# Patient Record
Sex: Male | Born: 1965 | Race: White | Hispanic: No | Smoking: Current every day smoker
Health system: Southern US, Community
[De-identification: ages and names within clinical notes are randomized; demographics above are authoritative.]

## PROBLEM LIST (undated history)

## (undated) DIAGNOSIS — I10 Essential (primary) hypertension: Secondary | ICD-10-CM

## (undated) DIAGNOSIS — E785 Hyperlipidemia, unspecified: Secondary | ICD-10-CM

## (undated) HISTORY — PX: WRIST SURGERY: SHX841

## (undated) HISTORY — DX: Hyperlipidemia, unspecified: E78.5

## (undated) HISTORY — DX: Essential (primary) hypertension: I10

## (undated) HISTORY — PX: KNEE SURGERY: SHX244

---

## 2006-05-16 ENCOUNTER — Ambulatory Visit (HOSPITAL_COMMUNITY): Admission: RE | Admit: 2006-05-16 | Discharge: 2006-05-16 | Payer: Self-pay | Admitting: Family Medicine

## 2007-06-10 ENCOUNTER — Ambulatory Visit (HOSPITAL_COMMUNITY): Admission: RE | Admit: 2007-06-10 | Discharge: 2007-06-10 | Payer: Self-pay | Admitting: Family Medicine

## 2007-11-16 ENCOUNTER — Ambulatory Visit (HOSPITAL_COMMUNITY): Admission: RE | Admit: 2007-11-16 | Discharge: 2007-11-16 | Payer: Self-pay | Admitting: Family Medicine

## 2007-12-17 ENCOUNTER — Ambulatory Visit (HOSPITAL_COMMUNITY): Admission: RE | Admit: 2007-12-17 | Discharge: 2007-12-17 | Payer: Self-pay | Admitting: Orthopaedic Surgery

## 2008-09-02 ENCOUNTER — Ambulatory Visit (HOSPITAL_COMMUNITY): Admission: RE | Admit: 2008-09-02 | Discharge: 2008-09-02 | Payer: Self-pay | Admitting: Family Medicine

## 2009-09-02 IMAGING — CR DG CHEST 2V
2 series · 2 of 2 positions shown · non-contrast
Comparison: None

CLINICAL DATA: Cough, congestion, smoker

CHEST - 2 VIEW

[view not recorded (1 of 2)]
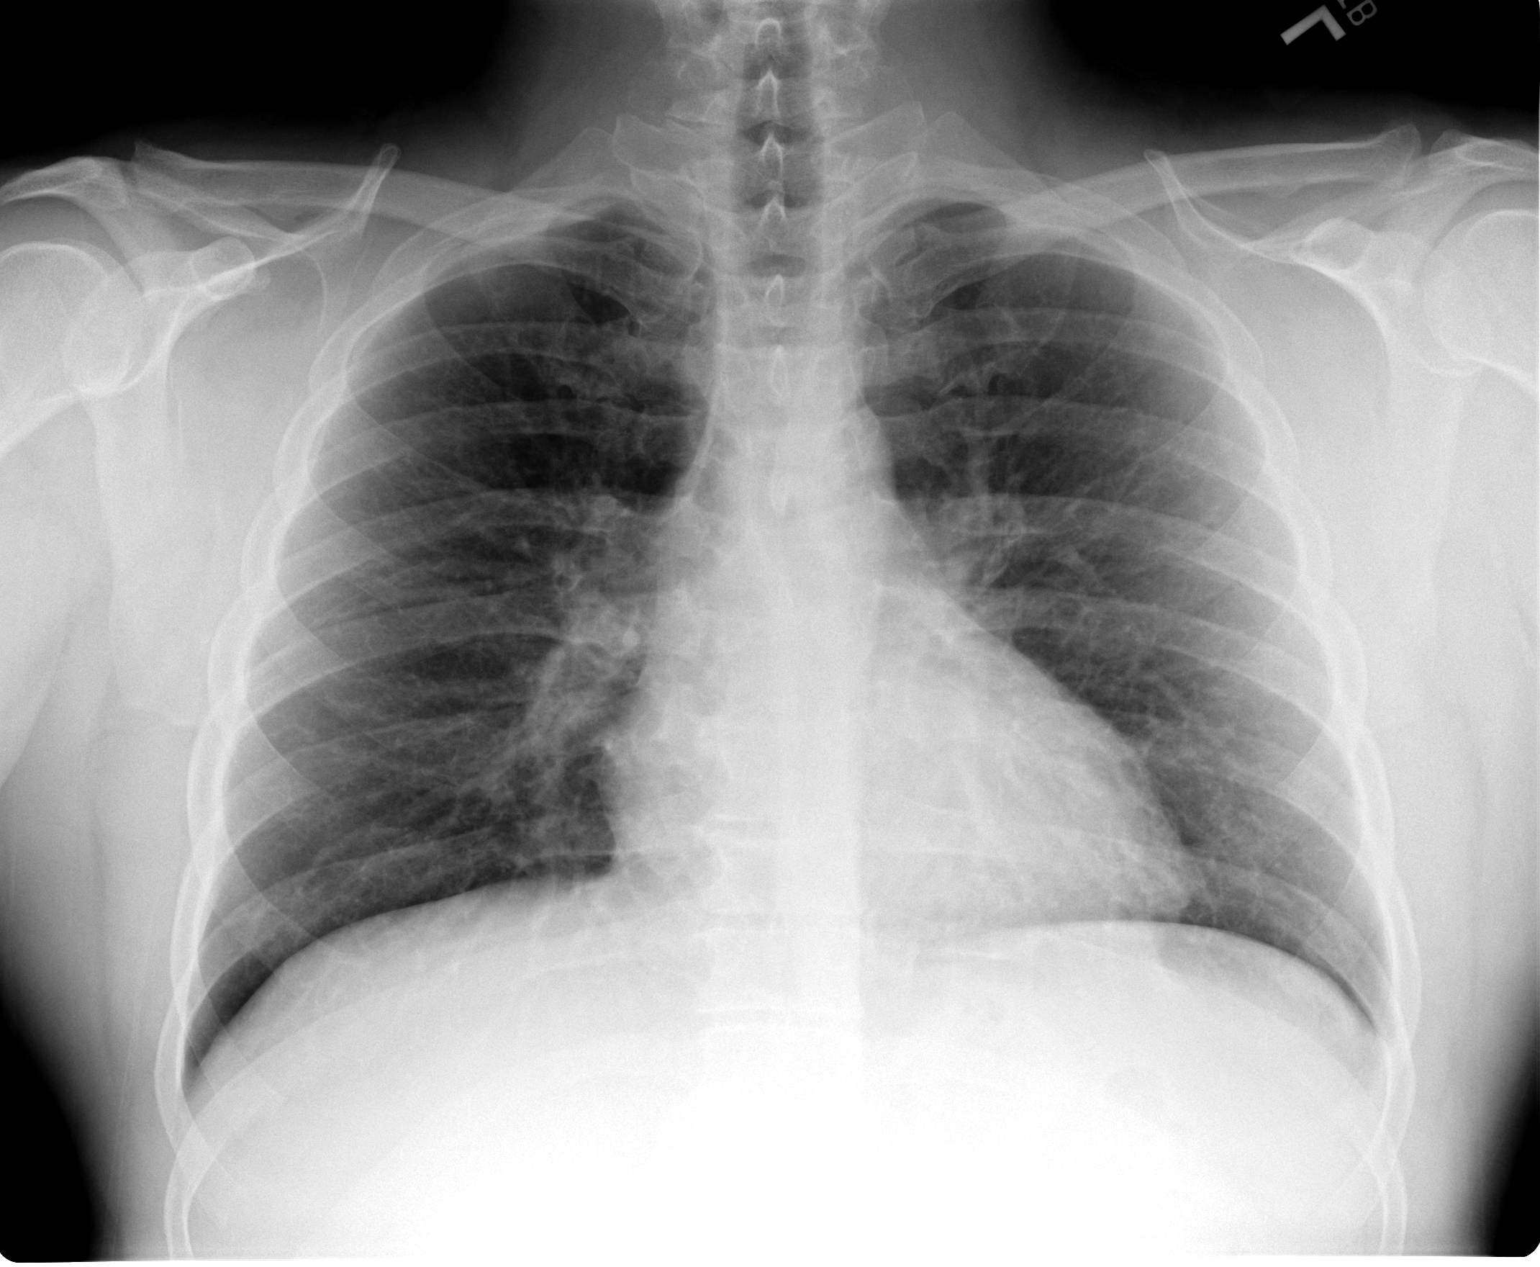

[view not recorded (2 of 2)]
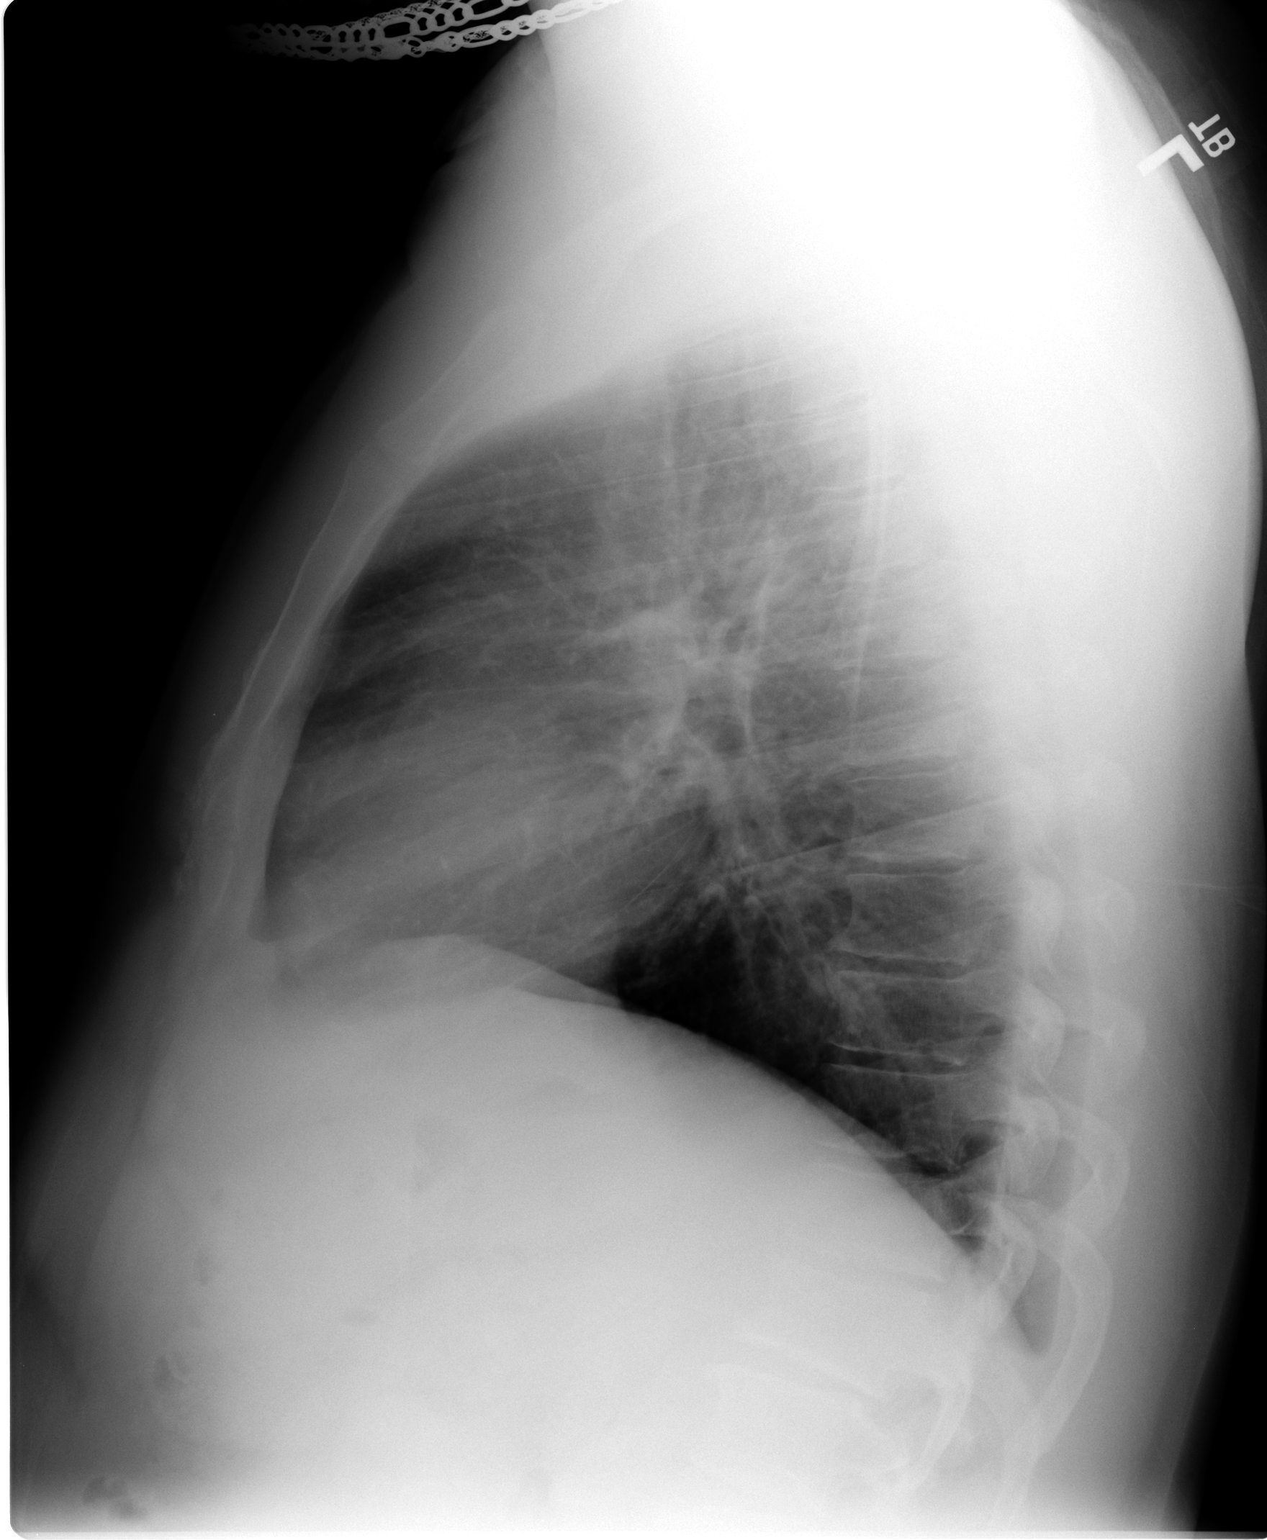

[2 of 2 positions shown; findings below may reference images not displayed]

FINDINGS: Upper normal heart size.
Normal mediastinal contours and pulmonary vascularity.
Bilateral peribronchial thickening.
No pulmonary infiltrate, pleural effusion, or pneumothorax.
Bones unremarkable.
IMPRESSION: Mild to moderate bronchitic changes.

## 2011-01-01 NOTE — H&P (Signed)
NAME:  Aaron Griffith, KISSLER NO.:  [id number]   MEDICAL RECORD NO.:  [id number]          PATIENT TYPE:  AMB   LOCATION:  DAY                           FACILITY:  [facility]   PHYSICIAN:  J. Darreld Mclean, M.D. DATE OF BIRTH:  Dec 29, 1965   DATE OF ADMISSION:  DATE OF DISCHARGE:  LH                              HISTORY & PHYSICAL   Patient is a 45 year old male I first saw for the first time April 14.  He complained of pain and tenderness, left knee, that occurred several  months earlier.  The knee got better, but then, several weeks before I  saw him, it started getting worse.  He saw Dr. Phillips Odor on March 24; MRI  of the left knee was ordered.  The MRI was done on March 30.  It showed  a complex tear of the medial meniscus of the left knee with some  displacement.  Patient said he was tired of his knee hurting and wanted  to consider surgery.  He wanted to delay surgery until the end of April  due to personal work reasons.   PAST HISTORY:  Positive for hypertension and he denies heart disease,  lung disease, kidney disease, stroke, paralysis, weakness, diabetes,  ulcer disease.   ALLERGIES:  He has no allergies.   CURRENT MEDICATIONS:  He takes Benicar 1.5 mg daily.   SOCIAL HISTORY:  Patient smokes and uses alcoholic beverages socially.  Dr. Phillips Odor is his family doctor.  The patient lives in [place] and he is  single.   PAST SURGICAL HISTORY:  He has never had any surgery.   FAMILY HISTORY:  Hypertension runs in the family.   PHYSICAL EXAM:  VITAL SIGNS:  Blood pressure is 120/80, pulse 100,  respirations 20, afebrile, 5 feet 4.25 inches, 203 pounds.  HEENT:  Negative.  GENERAL:  He is alert, cooperative, oriented.  NECK:  Supple.  LUNGS:  Clear to P and A.  HEART:  Regular rhythm without murmur heard.  ABDOMEN:  Soft, nontender, without masses.  EXTREMITIES:  The left knee is tender.  He has an effusion.  He has  medial joint line pain.  Positive McMurray  medially.  Range of motion is  from 0 to 110 with pain, particularly medially.  Other extremities are  negative.  CNS:  Intact.  SKIN:  Intact.   IMPRESSION:  Tear, left knee medial meniscus.   PLAN:  Operative arthroscopy of the left knee was discussed with the  patient and planned procedure, risks, and imponderables.  He understands  this is an elective outpatient surgery.  Physical therapy has been  arranged postoperatively.  Labs are pending.                                            ______________________________  J. Darreld Mclean, M.D.     JWK/MEDQ  D:  12/16/2007  T:  12/16/2007  Job:  [id number]

## 2011-01-01 NOTE — Op Note (Signed)
NAME:  Aaron, Griffith NO.:  [id number]   MEDICAL RECORD NO.:  [id number]          PATIENT TYPE:  AMB   LOCATION:  DAY                           FACILITY:  [facility]   PHYSICIAN:  J. Darreld Mclean, M.D. DATE OF BIRTH:  April 30, 1966   DATE OF PROCEDURE:  DATE OF DISCHARGE:                               OPERATIVE REPORT   PREOPERATIVE DIAGNOSIS:  Tear, medial meniscus of the left knee.   POSTOPERATIVE DIAGNOSIS:  Tear, medial meniscus of the left knee.   PROCEDURES:  1. Operative arthroscopy.  2. Partial medial meniscectomy, left knee.   ANESTHESIA:  General.   TOURNIQUET TIME:  18 minutes.   No drains.   SURGEON:  J. Darreld Mclean, M.D.   INDICATIONS:  The patient is a 45 year old male with pain and tenderness  in his left knee.  He was seen by Dr. Phillips Odor.  MRI shows tear of the  medial meniscus of the left knee.  Surgery was recommended by operative  arthroscopy.  He understands this is an elective procedure.  Risks and  imponderables of the procedure were discussed, and he appeared to  understand and agreed with the procedure as outlined.   DESCRIPTION OF PROCEDURE:  The patient was seen in the holding area.  The left knee was identified as the correct surgical site.  He placed a  mark in the left knee, and I placed a mark on the left knee.  He was  brought to the operating room, placed supine, given general anesthesia.  Tourniquet and leg holder were placed, deflated on left upper thigh.  He  was prepped and draped in the usual manner.  A time-out identifying Mr.  Fiorenza as the patient and the left knee as correct surgical site.  Leg  was elevated, wrapped circumferentially with an Esmarch bandage,  tourniquet inflated to 300 mmHg.  Esmarch bandage removed.  Inflow  cannula inserted medially.  Lactated Ringer's instilled into the knee by  an infusion pump.  Arthroscope was inserted and the knee was  systematically examined.  Intraoperative pictures were  taken.   The suprapatellar pouch had synovitis, undersurface of patella had grade  II changes.  Medially, there were grade II to grade IV changes in the  far-posterior corner where the tear was.  He had a significant tear of  the posterior horn of the medial meniscus complex, stellate-type.  There  were no loose bodies.  Anterior cruciate was intact laterally, grade II  changes, no loose bodies, meniscus looked good.   Attention was directed back to the medial side, using meniscal shaver  and meniscal punch, a good smooth contour was obtained.  Permanent  pictures were taken.  Knee was systematically reexamined and no  pathology found.  The wounds were reapproximated using 3-0 nylon in  interrupted vertical mattress manner.  Marcaine 0.25% instilled in each  portal.  Tourniquet deflated after 18 minutes.  Sterile dressing  applied.  Bulky dressing applied.  The patient tolerated the procedure  well and will go to recovery in good condition.  A prescription for  Vicodin ES was  given for pain.  I will see him in the office in  approximately 10 days to 2 weeks.  Physical therapy has been arranged,  and if he has any difficulties, he is to contact me through the office  or hospital beeper or through my office during regular hours.           ______________________________  Shela Commons. Darreld Mclean, M.D.     JWK/MEDQ  D:  12/17/2007  T:  12/18/2007  Job:  [id number]   cc:   Dr. Phillips Odor

## 2011-05-14 LAB — DIFFERENTIAL
Basophils Relative: 1
Eosinophils Absolute: 0.4
Eosinophils Relative: 7 — ABNORMAL HIGH
Lymphocytes Relative: 24
Lymphs Abs: 1.6
Monocytes Absolute: 0.5
Monocytes Relative: 8
Neutro Abs: 3.9

## 2011-05-14 LAB — URINALYSIS, ROUTINE W REFLEX MICROSCOPIC
Hgb urine dipstick: NEGATIVE
Nitrite: NEGATIVE
Urobilinogen, UA: 0.2

## 2011-05-14 LAB — COMPREHENSIVE METABOLIC PANEL
ALT: 43
AST: 28
Albumin: 4.1
Alkaline Phosphatase: 97
CO2: 29
Calcium: 9.4
GFR calc Af Amer: >60
Glucose, Bld: 106 — ABNORMAL HIGH
Total Bilirubin: 1

## 2011-05-14 LAB — CBC
HCT: 45.1
Hemoglobin: 16.1
MCV: 99.3
RBC: 4.54
RDW: 12.9

## 2018-02-02 ENCOUNTER — Other Ambulatory Visit (HOSPITAL_COMMUNITY): Payer: Self-pay | Admitting: Family Medicine

## 2018-02-02 DIAGNOSIS — R748 Abnormal levels of other serum enzymes: Secondary | ICD-10-CM

## 2018-02-09 ENCOUNTER — Ambulatory Visit (HOSPITAL_COMMUNITY)
Admission: RE | Admit: 2018-02-09 | Discharge: 2018-02-09 | Disposition: A | Discharge status: Home / Self Care | Payer: 59 | Source: Ambulatory Visit | Attending: Family Medicine | Admitting: Family Medicine

## 2018-02-09 DIAGNOSIS — N281 Cyst of kidney, acquired: Secondary | ICD-10-CM | POA: Diagnosis not present

## 2018-02-09 DIAGNOSIS — K760 Fatty (change of) liver, not elsewhere classified: Secondary | ICD-10-CM | POA: Diagnosis not present

## 2018-02-09 DIAGNOSIS — R748 Abnormal levels of other serum enzymes: Secondary | ICD-10-CM | POA: Diagnosis present

## 2020-05-02 IMAGING — US US ABDOMEN COMPLETE
1 series · 14 of 25 positions shown · non-contrast
Comparison: None.

CLINICAL DATA: Elevated LFTs.

EXAM:
ABDOMEN ULTRASOUND COMPLETE

[Series 1: us abdomen complete · 0.21mm/px · 14 of 101 slices shown]
[im 1/101]
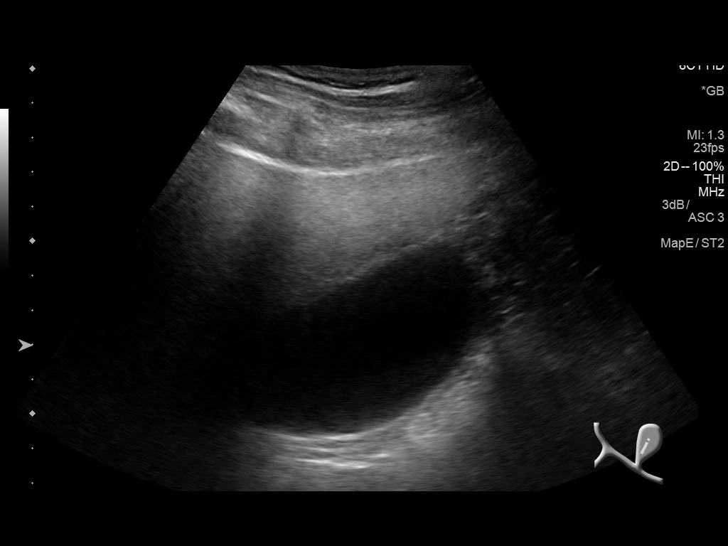
[im 9/101]
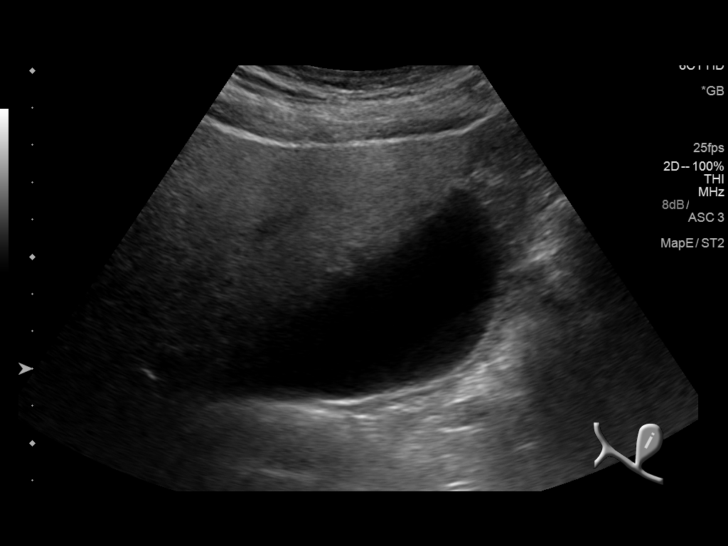
[im 17/101]
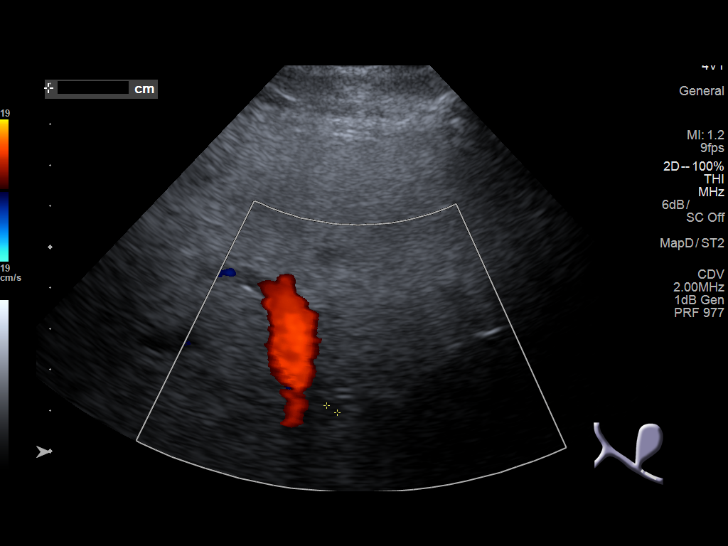
[im 26/101]
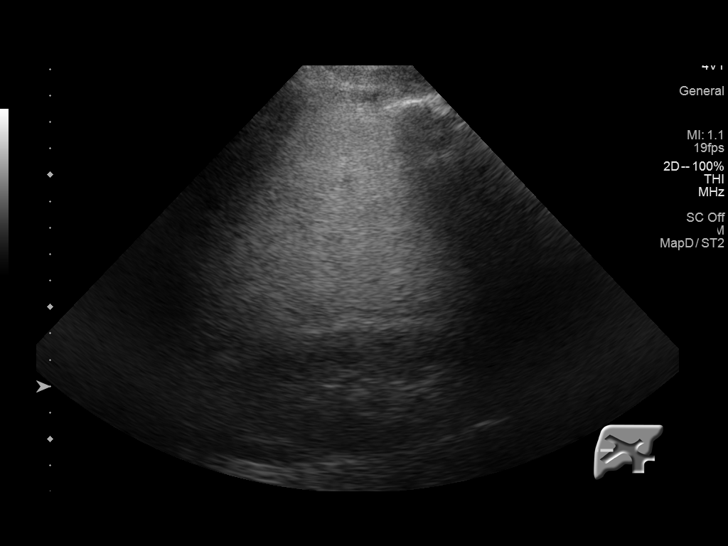
[im 34/101]
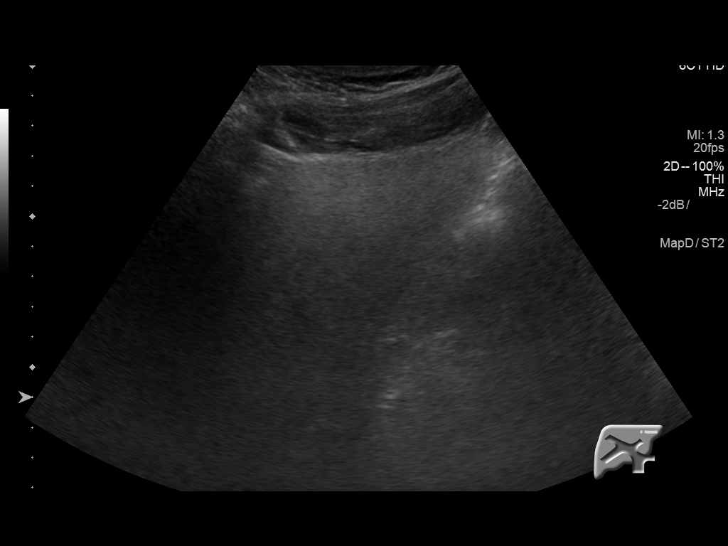
[im 38/101]
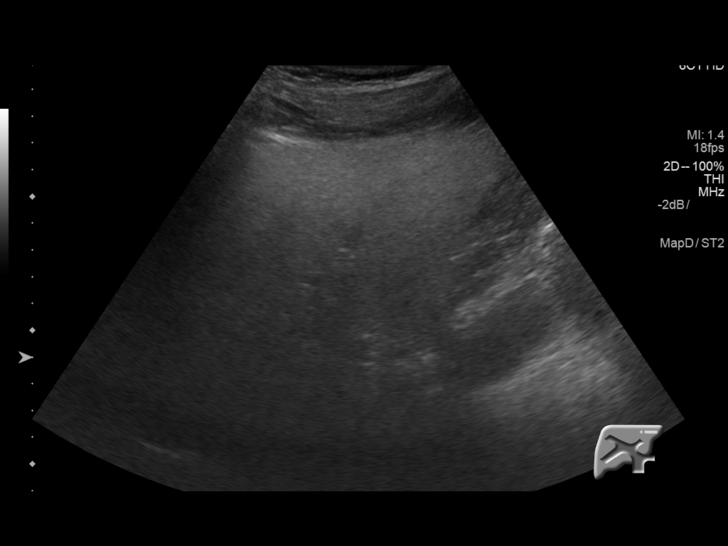
[im 46/101]
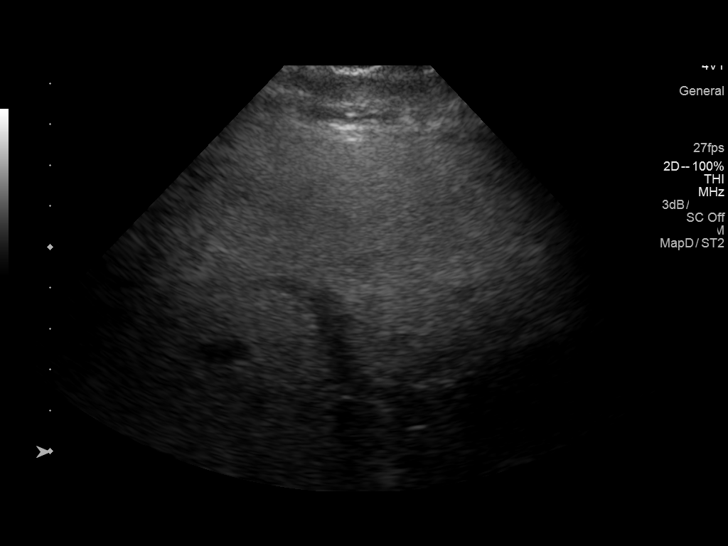
[im 55/101]
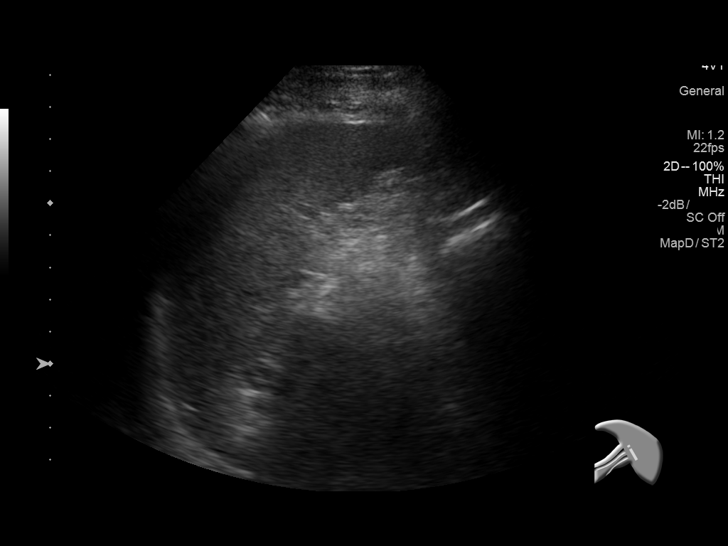
[im 63/101]
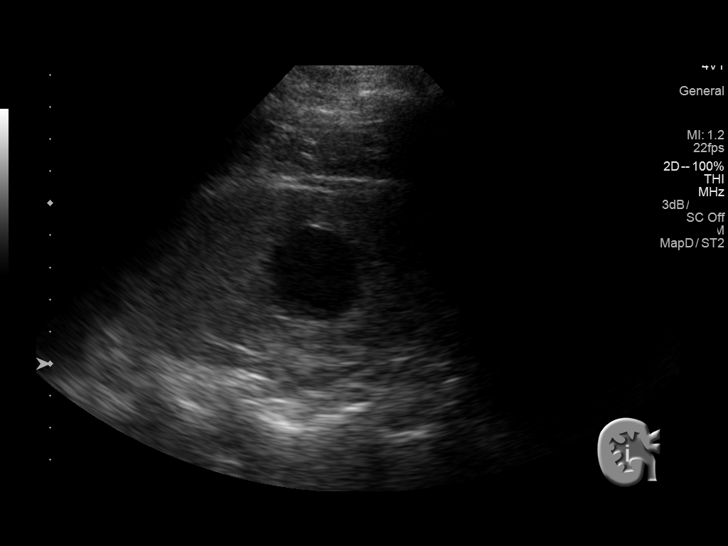
[im 67/101]
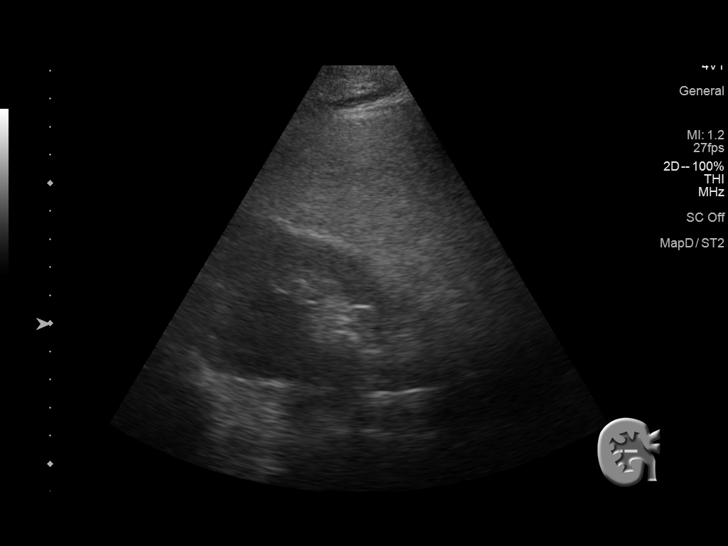
[im 76/101]
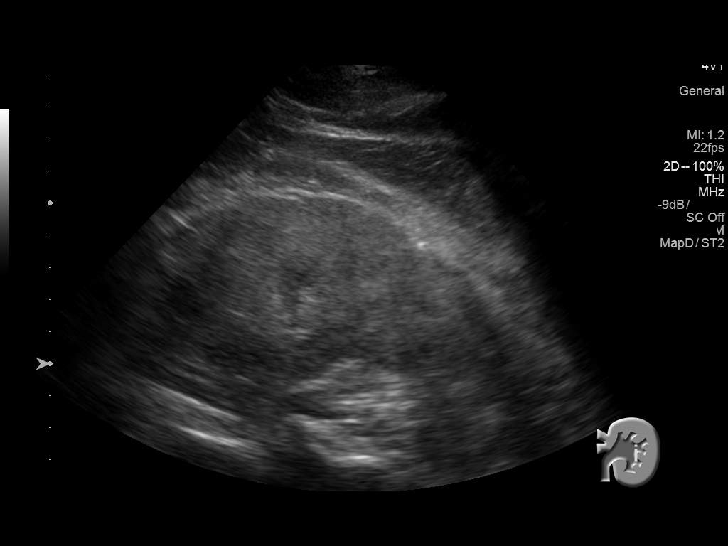
[im 84/101]
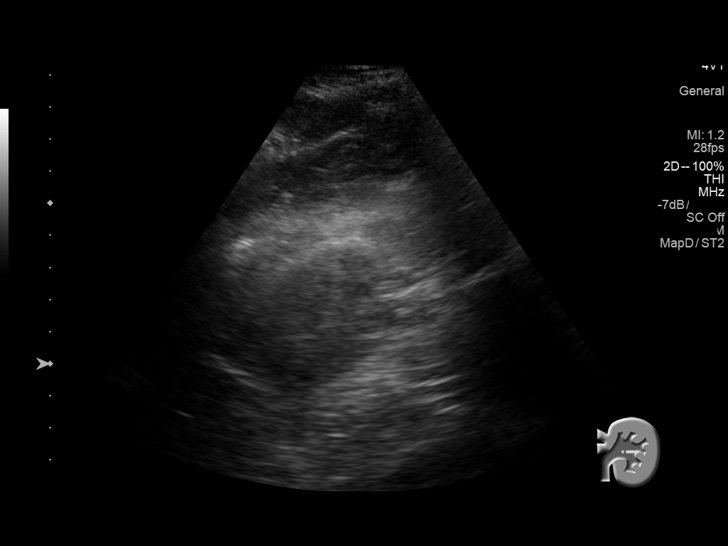
[im 92/101]
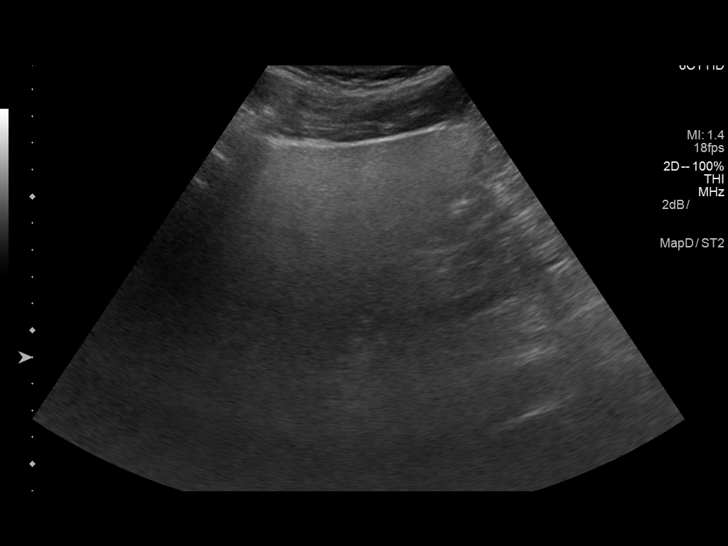
[im 101/101]
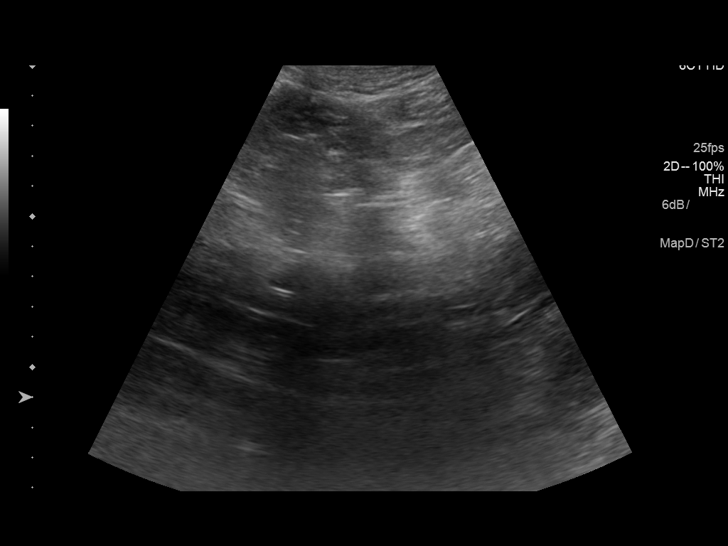

[14 of 25 positions shown; findings below may reference images not displayed]

FINDINGS: Gallbladder: No gallstones or wall thickening visualized. No
sonographic Murphy sign noted by sonographer.

Common bile duct: Diameter: 3.1 mm

Liver: Hepatic steatosis. Portal vein is patent on color Doppler
imaging with normal direction of blood flow towards the liver.

IVC: No abnormality visualized.

Pancreas: Partially obscured by bowel gas.  No abnormalities noted.

Spleen: Size and appearance within normal limits.

Right Kidney: Length: 12.7 cm.  Contains a 3.1 cm cyst.

Left Kidney: Length: 12.6 cm. Echogenicity within normal limits. No
mass or hydronephrosis visualized.

Abdominal aorta: Limited visualization due to shadowing bowel gas.
No aneurysm seen.

Other findings: None.
IMPRESSION: 1. Evaluation of the common bile duct, pancreas, the abdominal aorta
is limited due to shadowing bowel gas.
2. Within limitations, hepatic steatosis is noted. There is a right
renal cyst. No other abnormalities.

## 2022-02-06 ENCOUNTER — Encounter (INDEPENDENT_AMBULATORY_CARE_PROVIDER_SITE_OTHER): Payer: Self-pay | Admitting: *Deleted

## 2022-04-04 ENCOUNTER — Ambulatory Visit (INDEPENDENT_AMBULATORY_CARE_PROVIDER_SITE_OTHER): Payer: 59 | Admitting: Gastroenterology

## 2022-04-04 ENCOUNTER — Encounter (INDEPENDENT_AMBULATORY_CARE_PROVIDER_SITE_OTHER): Payer: Self-pay | Admitting: Gastroenterology

## 2022-04-04 VITALS — BP 129/84 | HR 80 | Temp 98.7°F | Ht 62.0 in | Wt 196.8 lb

## 2022-04-04 DIAGNOSIS — R7989 Other specified abnormal findings of blood chemistry: Secondary | ICD-10-CM | POA: Insufficient documentation

## 2022-04-04 DIAGNOSIS — D696 Thrombocytopenia, unspecified: Secondary | ICD-10-CM | POA: Diagnosis not present

## 2022-04-04 NOTE — Patient Instructions (Signed)
It was nice to meet you! As discussed, liver enzymes could be related to fatty liver, however, we will do further workup with labs and [place] to rule out cirrhosis, autoimmune process or viral infection as the cause I am providing the mediterranean diet as this is a "liver Protective" diet and can help with decreased fat deposit in the liver Recommend atleast 30 minutes physical activity 5x/week with decrease In overall weight by about 5% in patient's with known fatty liver.  Please consider screening colonoscopy.  Follow up will be determined after labs and [place] result

## 2022-04-04 NOTE — Progress Notes (Signed)
Referring Provider: Sharilyn Sites, MD Primary Care Physician:  Sharilyn Sites, MD Primary GI Physician: New  Chief Complaint  Patient presents with   Elevated Hepatic Enzymes    New patient. Referred for elevated liver enzymes.    HPI:   Aaron Griffith is a 56 y.o. male with past medical history of HTN, Hyperlipemia.   Patient presenting today as a new patient for elevated LFTs   Labs done by PCP in March with platelet count 121k, hgb 16.2, T bili 1, AST 60, ALT 66, ALk Phos 121.   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 Denise otc or herbal supplements, no new medications.  No red flag symptoms. Patient denies melena, hematochezia, nausea, vomiting, diarrhea, constipation, dysphagia, odyonophagia, early satiety or weight loss. Drinks only on occasion. No illicit drug use now, previously on marijuana. Notably is on zocor and omega 3 fish oil but these are not new.   NSAID FKC:LEXN occasionally  Social TZ:GYFVCB 1 PPD, alcohol on occasion  Fam hx: no crc or liver disease, no pancreatic cancer  Last Colonoscopy:never Last Endoscopy:never  Recommendations:    Past Medical History:  Diagnosis Date   Hyperlipemia    Hypertension     Past Surgical History:  Procedure Laterality Date   KNEE SURGERY Left    WRIST SURGERY Right    cyst removed from right wrist in childhood    Current Outpatient Medications  Medication Sig Dispense Refill   aspirin EC 81 MG tablet Take 81 mg by mouth daily. Swallow whole.     famotidine (PEPCID) 20 MG tablet Take 20 mg by mouth daily.     losartan-hydrochlorothiazide (HYZAAR) 100-25 MG tablet Take 1 tablet by mouth daily.     meloxicam (MOBIC) 15 MG tablet Take 15 mg by mouth daily.     metoprolol succinate (TOPROL-XL) 50 MG 24 hr tablet Take 50 mg by mouth daily.      Omega-3 Fatty Acids (FISH OIL OMEGA-3 PO) Take by mouth. One daily     simvastatin (ZOCOR) 20 MG tablet Take 20 mg by mouth at bedtime.     No current facility-administered medications for this visit.    Allergies as of 04/04/2022   (No Known Allergies)    Family History  Problem Relation Age of Onset   Cancer Father        lung    Social History   Socioeconomic History   Marital status: Divorced    Spouse name: Not on file   Number of children: Not on file   Years of education: Not on file   Highest education level: Not on file  Occupational History   Not on file  Tobacco Use   Smoking status: Every Day    Types: Cigarettes    Passive exposure: Current   Smokeless tobacco: Never  Substance and Sexual Activity   Alcohol use: Yes    Comment: occasional on weekends   Drug use: Never   Sexual activity: Not on file  Other Topics Concern   Not on file  Social History Narrative   Not on file   Social Determinants of Health   Financial Resource Strain: Not on file  Food Insecurity: Not on file  Transportation Needs: Not on file  Physical Activity: Not on file  Stress: Not on  file  Social Connections: Not on file   Review of systems General: negative for malaise, night sweats, fever, chills, weight loss Neck: Negative for lumps, goiter, pain and significant neck swelling Resp: Negative for cough, wheezing, dyspnea at rest CV: Negative for chest pain, leg swelling, palpitations, orthopnea GI: denies melena, hematochezia, nausea, vomiting, diarrhea, constipation, dysphagia, odyonophagia, early satiety or unintentional weight loss.  MSK: Negative for joint pain or swelling, back pain, and muscle pain. Derm: Negative for itching or rash Psych: Denies depression, anxiety, memory loss, confusion. No homicidal or suicidal ideation.  Heme: Negative for prolonged bleeding, bruising easily, and swollen nodes. Endocrine: Negative for cold or heat intolerance, polyuria,  polydipsia and goiter. Neuro: negative for tremor, gait imbalance, syncope and seizures. The remainder of the review of systems is noncontributory.  Physical Exam: BP 129/84 (BP Location: Left Arm, Patient Position: Sitting, Cuff Size: Large)   Pulse 80   Temp 98.7 F (37.1 C) (Oral)   Ht _0  (1.575 m)   Wt 196 lb 12.8 oz (89.3 kg)   BMI 36.00 kg/m  General:   Alert and oriented. No distress noted. Pleasant and cooperative.  Head:  Normocephalic and atraumatic. Eyes:  Conjuctiva clear without scleral icterus. Mouth:  Oral mucosa pink and moist. Good dentition. No lesions. Heart: Normal rate and rhythm, s1 and s2 heart sounds present.  Lungs: Clear lung sounds in all lobes. Respirations equal and unlabored. Abdomen:  +BS, soft, non-tender and non-distended. No rebound or guarding. No HSM or masses noted. Derm: No palmar erythema or jaundice Msk:  Symmetrical without gross deformities. Normal posture. Extremities:  Without edema. Neurologic:  Alert and  oriented x4 Psych:  Alert and cooperative. Normal mood and affect.  Invalid input(s): "6 MONTHS"   ASSESSMENT: Aaron Griffith is a 56 y.o. male presenting today as a new patient for elevated LFTs and thrombocytopenia.  newly elevated LFTs, per patient.  History of hepatic steatosis as noted on US imaging in 2019. Etoh only on occasion, no new medications, otc or herbal supplements. Notably on omega 3 fish oil which has been known to elevate LFTs. No hx of illicit drug use other than marijuana in the past. No red flag symptoms. Patient denies melena, hematochezia, nausea, vomiting, diarrhea, constipation, dysphagia, odyonophagia, early satiety, swelling to abdomen or legs, or weight loss.   Discussion had with patient regarding possible etiology of elevated LFTs to include fatty liver, cirrhosis, AIH, viral infection, iron overload, as well as importance of implementing mediterranean diet and regular exercise, at least 30 minutes a  day, 5x/week with overall reduction of weight by 5%. Continued avoidance of ETOH. Recommend proceeding with further serologies and US liver elastography to further determine etiology of elevated LFTs.   Patient has never had CRC screening, discussed indications for screening colonoscopy with the patient. He is at average risk and has no family hx of CRC. He will think about this and let me know if he wishes to proceed with colonoscopy.    PLAN:  US Liver elastography  2. Acute hep panel, AIH serologies, Iron studies  3. Pt to consider CRC screening via colonoscopy  4. Implement mediterranean diet  5. 30 minutes of physical activity at least 5x/week w/overall weight reduction of 5%  All questions were answered, patient verbalized understanding and is in agreement with plan as outlined above.   Follow Up: TBD after workup  Isahia Hollerbach L. Alver Sorrow, MSN, APRN, AGNP-C Adult-Gerontology Nurse Practitioner [facility]

## 2022-04-06 LAB — IRON, TOTAL/TOTAL IRON BINDING CAP
Iron: 135 ug/dL (ref 50–180)
TIBC: 290 mcg/dL (calc) (ref 250–425)

## 2022-04-08 LAB — IGG, IGA, IGM
IgM, Serum: 49 mg/dL — ABNORMAL LOW (ref 50–300)
Immunoglobulin A: 143 mg/dL (ref 47–310)

## 2022-04-08 LAB — HEPATITIS PANEL, ACUTE
Hep A IgM: NONREACTIVE
Hep B C IgM: NONREACTIVE
Hepatitis B Surface Ag: NONREACTIVE
Hepatitis C Ab: NONREACTIVE

## 2022-04-08 LAB — ANA: Anti Nuclear Antibody (ANA): NEGATIVE

## 2022-04-09 LAB — ANTI-SMOOTH MUSCLE ANTIBODY, IGG: Actin (Smooth Muscle) Antibody (IGG): <20 U (ref <20)

## 2022-04-09 LAB — IRON, TOTAL/TOTAL IRON BINDING CAP: %SAT: 47 % (calc) (ref 20–48)

## 2022-04-11 LAB — CERULOPLASMIN: Ceruloplasmin: 21 mg/dL (ref 18–36)

## 2022-04-11 LAB — MITOCHONDRIAL ANTIBODIES: Mitochondrial M2 Ab, IgG: <=20 U (ref <=20.0)

## 2022-04-11 LAB — ALPHA-1-ANTITRYPSIN: A-1 Antitrypsin, Ser: 126 mg/dL (ref 83–199)

## 2022-04-11 LAB — IGG, IGA, IGM: IgG (Immunoglobin G), Serum: 847 mg/dL (ref 600–1640)

## 2022-04-15 ENCOUNTER — Ambulatory Visit (HOSPITAL_COMMUNITY)
Admission: RE | Admit: 2022-04-15 | Discharge: 2022-04-15 | Disposition: A | Discharge status: Home / Self Care | Payer: 59 | Source: Ambulatory Visit | Attending: Gastroenterology | Admitting: Gastroenterology

## 2022-04-15 DIAGNOSIS — D696 Thrombocytopenia, unspecified: Secondary | ICD-10-CM | POA: Diagnosis present

## 2022-04-15 DIAGNOSIS — R7989 Other specified abnormal findings of blood chemistry: Secondary | ICD-10-CM | POA: Diagnosis present

## 2022-04-17 ENCOUNTER — Other Ambulatory Visit (INDEPENDENT_AMBULATORY_CARE_PROVIDER_SITE_OTHER): Payer: Self-pay | Admitting: Gastroenterology

## 2022-04-17 DIAGNOSIS — R932 Abnormal findings on diagnostic imaging of liver and biliary tract: Secondary | ICD-10-CM

## 2022-04-17 DIAGNOSIS — R7989 Other specified abnormal findings of blood chemistry: Secondary | ICD-10-CM

## 2023-10-24 ENCOUNTER — Other Ambulatory Visit (HOSPITAL_COMMUNITY): Payer: Self-pay | Admitting: Family Medicine

## 2023-10-24 DIAGNOSIS — F17200 Nicotine dependence, unspecified, uncomplicated: Secondary | ICD-10-CM

## 2023-10-31 ENCOUNTER — Ambulatory Visit (HOSPITAL_COMMUNITY)

## 2023-10-31 ENCOUNTER — Encounter (HOSPITAL_COMMUNITY): Payer: Self-pay

## 2023-12-23 ENCOUNTER — Ambulatory Visit (HOSPITAL_COMMUNITY)
Admission: RE | Admit: 2023-12-23 | Discharge: 2023-12-23 | Disposition: A | Discharge status: Home / Self Care | Source: Ambulatory Visit | Attending: Family Medicine | Admitting: Family Medicine

## 2023-12-23 DIAGNOSIS — F17200 Nicotine dependence, unspecified, uncomplicated: Secondary | ICD-10-CM | POA: Diagnosis present
